# Patient Record
Sex: Male | Born: 1960 | Race: White | Hispanic: No | Smoking: Former smoker
Health system: Southern US, Community
[De-identification: ages and names within clinical notes are randomized; demographics above are authoritative.]

## PROBLEM LIST (undated history)

## (undated) DIAGNOSIS — I10 Essential (primary) hypertension: Secondary | ICD-10-CM

## (undated) DIAGNOSIS — E119 Type 2 diabetes mellitus without complications: Secondary | ICD-10-CM

## (undated) DIAGNOSIS — F329 Major depressive disorder, single episode, unspecified: Secondary | ICD-10-CM

## (undated) DIAGNOSIS — F32A Depression, unspecified: Secondary | ICD-10-CM

## (undated) DIAGNOSIS — G8929 Other chronic pain: Secondary | ICD-10-CM

## (undated) DIAGNOSIS — F419 Anxiety disorder, unspecified: Secondary | ICD-10-CM

## (undated) HISTORY — PX: WRIST SURGERY: SHX841

---

## 2011-07-03 ENCOUNTER — Emergency Department (HOSPITAL_BASED_OUTPATIENT_CLINIC_OR_DEPARTMENT_OTHER)
Admission: EM | Admit: 2011-07-03 | Discharge: 2011-07-03 | Disposition: A | Discharge status: Home / Self Care | Payer: 59 | Attending: Emergency Medicine | Admitting: Emergency Medicine

## 2011-07-03 ENCOUNTER — Encounter (HOSPITAL_BASED_OUTPATIENT_CLINIC_OR_DEPARTMENT_OTHER): Payer: Self-pay | Admitting: *Deleted

## 2011-07-03 DIAGNOSIS — Z87891 Personal history of nicotine dependence: Secondary | ICD-10-CM | POA: Insufficient documentation

## 2011-07-03 DIAGNOSIS — M5430 Sciatica, unspecified side: Secondary | ICD-10-CM

## 2011-07-03 DIAGNOSIS — M549 Dorsalgia, unspecified: Secondary | ICD-10-CM | POA: Insufficient documentation

## 2011-07-03 MED ORDER — CYCLOBENZAPRINE HCL 10 MG PO TABS: 10.0000 mg | ORAL_TABLET | Freq: Three times a day (TID) | ORAL | Status: AC | PRN

## 2011-07-03 MED ORDER — CEPHALEXIN 500 MG PO CAPS: 500.0000 mg | ORAL_CAPSULE | Freq: Four times a day (QID) | ORAL | Status: AC

## 2011-07-03 MED ORDER — HYDROCODONE-ACETAMINOPHEN 7.5-325 MG PO TABS: 1.0000 | ORAL_TABLET | Freq: Four times a day (QID) | ORAL | Status: AC | PRN

## 2011-07-03 NOTE — Discharge Instructions (Signed)

## 2011-07-03 NOTE — ED Provider Notes (Signed)
Medical screening examination/treatment/procedure(s) were performed by non-physician practitioner and as supervising physician I was immediately available for consultation/collaboration.    Temesha Queener R Naylah Cork, MD 07/03/11 2333 

## 2011-07-03 NOTE — ED Provider Notes (Signed)
History     CSN: [id number]  Arrival date & time 07/03/11  1445   First MD Initiated Contact with Patient 07/03/11 1617      Chief Complaint  Patient presents with  . Back Pain    (Consider location/radiation/quality/duration/timing/severity/associated sxs/prior treatment) Patient is a 51 y.o. male presenting with leg pain. The history is provided by the patient. No language interpreter was used.  Leg Pain  The incident occurred more than 1 week ago. The incident occurred at work. There was no injury mechanism. The pain is present in the left hip and left thigh. The quality of the pain is described as aching. The pain is at a severity of 6/10. The pain is moderate. The pain has been constant since onset. Pertinent negatives include no numbness. He reports no foreign bodies present. The symptoms are aggravated by nothing. He has tried NSAIDs and acetaminophen for the symptoms. The treatment provided no relief.  Pt has been treated with prednisone, meloxicam, hydrocodone and flexeril,    History reviewed. No pertinent past medical history.  History reviewed. No pertinent past surgical history.  History reviewed. No pertinent family history.  History  Substance Use Topics  . Smoking status: Former Games developer  . Smokeless tobacco: Not on file  . Alcohol Use: Yes      Review of Systems  Musculoskeletal: Positive for myalgias and joint swelling.  Neurological: Negative for numbness.  All other systems reviewed and are negative.    Allergies  Review of patient's allergies indicates no known allergies.  Home Medications   Current Outpatient Rx  Name Route Sig Dispense Refill  . ACETAMINOPHEN 500 MG PO TABS Oral Take 1,000 mg by mouth every 4 (four) hours as needed. For pain    . GREEN COFFEE BEAN PO Oral Take 1 capsule by mouth 2 (two) times daily.    . ADULT MULTIVITAMIN W/MINERALS CH Oral Take 1 tablet by mouth daily.    Marland Kitchen OVER THE COUNTER MEDICATION Oral Take 1 capsule by  mouth daily.    . CYCLOBENZAPRINE HCL 10 MG PO TABS Oral Take 10 mg by mouth 3 (three) times daily as needed. For pain    . HYDROCODONE-ACETAMINOPHEN 7.5-500 MG PO TABS Oral Take 1.5 tablets by mouth 3 (three) times daily as needed. For pain      BP 166/88  Pulse 82  Temp(Src) 98.2 F (36.8 C) (Oral)  Resp 20  Ht 5\' 9"  (1.753 m)  Wt 210 lb (95.255 kg)  BMI 31.01 kg/m2  SpO2 100%  Physical Exam  Nursing note and vitals reviewed. Constitutional: He is oriented to person, place, and time. He appears well-developed and well-nourished.  HENT:  Head: Normocephalic and atraumatic.  Musculoskeletal: He exhibits tenderness.       nontender ls spine,  Decreased range of motion,  Ns and nv intact  Neurological: He is alert and oriented to person, place, and time. He has normal reflexes.  Skin: Skin is warm and dry.  Psychiatric: He has a normal mood and affect.    ED Course  Procedures (including critical care time)  Labs Reviewed - No data to display No results found.   No diagnosis found.    MDM  Pt advised to follow up with Dr. Lajoyce Corners orthopaedist for complete evaluation.  I advised ice.  I will treat with hydrocone and flexerail as pt has some tempary relief with these        Elson Areas, [place] 07/03/11 1708

## 2011-07-03 NOTE — ED Notes (Addendum)
Pt states he missed a step on a ladder 4 weeks ago and caught himself. Since then, has had low back and buttock pain. Seen at [place]. Dx'd with sciatica. Given meds without relief. Vicodin and Flexeril did help.

## 2011-07-12 ENCOUNTER — Encounter (HOSPITAL_BASED_OUTPATIENT_CLINIC_OR_DEPARTMENT_OTHER): Payer: Self-pay | Admitting: *Deleted

## 2011-07-12 ENCOUNTER — Emergency Department (HOSPITAL_BASED_OUTPATIENT_CLINIC_OR_DEPARTMENT_OTHER)
Admission: EM | Admit: 2011-07-12 | Discharge: 2011-07-12 | Disposition: A | Discharge status: Home / Self Care | Payer: PRIVATE HEALTH INSURANCE | Attending: Emergency Medicine | Admitting: Emergency Medicine

## 2011-07-12 ENCOUNTER — Emergency Department (INDEPENDENT_AMBULATORY_CARE_PROVIDER_SITE_OTHER): Payer: PRIVATE HEALTH INSURANCE

## 2011-07-12 DIAGNOSIS — M79609 Pain in unspecified limb: Secondary | ICD-10-CM

## 2011-07-12 DIAGNOSIS — M549 Dorsalgia, unspecified: Secondary | ICD-10-CM | POA: Insufficient documentation

## 2011-07-12 DIAGNOSIS — M5410 Radiculopathy, site unspecified: Secondary | ICD-10-CM

## 2011-07-12 DIAGNOSIS — Z760 Encounter for issue of repeat prescription: Secondary | ICD-10-CM | POA: Insufficient documentation

## 2011-07-12 DIAGNOSIS — M533 Sacrococcygeal disorders, not elsewhere classified: Secondary | ICD-10-CM | POA: Insufficient documentation

## 2011-07-12 DIAGNOSIS — IMO0002 Reserved for concepts with insufficient information to code with codable children: Secondary | ICD-10-CM | POA: Insufficient documentation

## 2011-07-12 DIAGNOSIS — IMO0001 Reserved for inherently not codable concepts without codable children: Secondary | ICD-10-CM

## 2011-07-12 MED ORDER — CYCLOBENZAPRINE HCL 10 MG PO TABS: 10.0000 mg | ORAL_TABLET | Freq: Two times a day (BID) | ORAL | Status: AC | PRN

## 2011-07-12 MED ORDER — HYDROCODONE-ACETAMINOPHEN 7.5-500 MG PO TABS: 1.0000 | ORAL_TABLET | Freq: Four times a day (QID) | ORAL | Status: AC | PRN

## 2011-07-12 MED ORDER — KETOROLAC TROMETHAMINE 30 MG/ML IJ SOLN
30.0000 mg | Freq: Once | INTRAMUSCULAR | Status: AC
  Administered 2011-07-12: 30 mg via INTRAMUSCULAR
  Filled 2011-07-12: qty 1

## 2011-07-12 MED ORDER — OXYCODONE-ACETAMINOPHEN 5-325 MG PO TABS
1.0000 | ORAL_TABLET | Freq: Once | ORAL | Status: AC
  Administered 2011-07-12: 1 via ORAL
  Filled 2011-07-12: qty 1

## 2011-07-12 NOTE — ED Notes (Signed)
Patient states that his pain is down to a 4 at this time. Patient states that it feels like burning pain like "a red hot poker is being put in his back

## 2011-07-12 NOTE — ED Notes (Signed)
Missed follow up appt needs refill on meds

## 2011-07-12 NOTE — ED Provider Notes (Signed)
History     CSN: [id number]  Arrival date & time 07/12/11  1330   First MD Initiated Contact with Patient 07/12/11 1350      Chief Complaint  Patient presents with  . Back Pain  . Medication Refill    (Consider location/radiation/quality/duration/timing/severity/associated sxs/prior treatment) Patient is a 51 y.o. male presenting with back pain.  Back Pain     C/o back pain, LLE pain x 3 weeks. States that he stumbled backwards 3 weeks ago off ladder. Felt pull at that time. Was seen at [facility] and prescribed gabapentin and prednisone then seen here 3/31 and diagnosed with sciatica. Some relief with flexeril and norco. Denies hematuria/dysuria/freq/urgency C/O difficulty urinating when pain at maximum. Pain currently 5/10. States pain was worse yesterday. Denies history of recent trauma/falls. Denies h/o malignancy, DM, immunocompromise  injection drug use, immunosuppression, indwelling urinary catheter, prolonged steroid use, . No numbness/tingling/weakness of extremities. Occ numbness left lateral calf. Denies fever/chills. Denies saddle anesthesia, no urinary incontinence or retention. Has lost 100lbs in last one year with exercise/walking the dog    ED Notes, ED Provider Notes from 07/12/11 0000 to 07/12/11 13:41:09       Melissa Ramer Lottie Rater, RN 07/12/2011 13:39      Missed follow up appt needs refill on meds    History reviewed. No pertinent past medical history.  History reviewed. No pertinent past surgical history.  History reviewed. No pertinent family history.  History  Substance Use Topics  . Smoking status: Former Games developer  . Smokeless tobacco: Not on file  . Alcohol Use: Yes      Review of Systems  Musculoskeletal: Positive for back pain.  All other systems reviewed and are negative.   except as noted HPI   Allergies  Review of patient's allergies indicates no known allergies.  Home Medications   Current Outpatient Rx  Name Route Sig Dispense Refill  .  ACETAMINOPHEN 500 MG PO TABS Oral Take 1,000 mg by mouth every 4 (four) hours as needed. For pain    . CEPHALEXIN 500 MG PO CAPS Oral Take 1 capsule (500 mg total) by mouth 4 (four) times daily. 20 capsule 0  . CYCLOBENZAPRINE HCL 10 MG PO TABS Oral Take 1 tablet (10 mg total) by mouth 3 (three) times daily as needed. For pain 30 tablet 0  . CYCLOBENZAPRINE HCL 10 MG PO TABS Oral Take 1 tablet (10 mg total) by mouth 2 (two) times daily as needed for muscle spasms. 20 tablet 0  . GREEN COFFEE BEAN PO Oral Take 1 capsule by mouth 2 (two) times daily.    Marland Kitchen HYDROCODONE-ACETAMINOPHEN 7.5-500 MG PO TABS Oral Take 1 tablet by mouth every 6 (six) hours as needed for pain. 20 tablet 0  . HYDROCODONE-ACETAMINOPHEN 7.5-500 MG PO TABS Oral Take 1.5 tablets by mouth 3 (three) times daily as needed. For pain    . HYDROCODONE-ACETAMINOPHEN 7.5-325 MG PO TABS Oral Take 1 tablet by mouth every 6 (six) hours as needed for pain. 16 tablet 0  . ADULT MULTIVITAMIN W/MINERALS CH Oral Take 1 tablet by mouth daily.    Marland Kitchen OVER THE COUNTER MEDICATION Oral Take 1 capsule by mouth daily.      BP 179/79  Pulse 75  Temp(Src) 98.3 F (36.8 C) (Oral)  Resp 18  SpO2 99%  Physical Exam  Nursing note and vitals reviewed. Constitutional: He is oriented to person, place, and time. He appears well-developed and well-nourished. No distress.  HENT:  Head: Atraumatic.  Mouth/Throat: Oropharynx is clear and moist.  Eyes: Conjunctivae are normal. Pupils are equal, round, and reactive to light.  Neck: Neck supple.  Cardiovascular: Normal rate, regular rhythm, normal heart sounds and intact distal pulses.  Exam reveals no gallop and no friction rub.   No murmur heard. Pulmonary/Chest: Effort normal. No respiratory distress. He has no wheezes. He has no rales.  Abdominal: Soft. Bowel sounds are normal. There is no tenderness. There is no rebound and no guarding.  Musculoskeletal: Normal range of motion. He exhibits tenderness. He  exhibits no edema.       Straight leg raise neg b/l Gross sensation intact throughout Dec gross sensation lateral lower leg Cap refill < 3 sec DP/PT intact  +Lt SI ttp  Neurological: He is alert and oriented to person, place, and time.       Strength 5/5 bl LE  Skin: Skin is warm and dry.  Psychiatric: He has a normal mood and affect.    ED Course  Procedures (including critical care time)  Labs Reviewed - No data to display Dg Sacrum/coccyx  07/12/2011  *RADIOLOGY REPORT*  Clinical Data: Gluteal pain.  Left leg and femur pain.  SACRUM AND COCCYX - 2+ VIEW  Comparison: None.  Findings: Sacrum and coccyx appear within normal limits. Visualized pelvic bones are normal.  No displaced fracture is identified.  IMPRESSION: Negative.  Original Report Authenticated By: Andreas Newport, M.D.     1. Disorder of SI (sacroiliac) joint   2. Radiculopathy       MDM  Lt back/buttocks pain with radiation to leg and intermittent numbness/paresthesias. XR ordered 2/2 recent significant weight loss. Negative for tumor. Neurovascularly intact at this time. Feeling better in ED, will discharge home with pain medication, f/u as needed. Precautions for return.        Forbes Cellar, MD 07/13/11 [phone]

## 2011-07-12 NOTE — Discharge Instructions (Signed)
Sacroiliac Joint Dysfunction The sacroiliac joint connects the lower part of the spine (the sacrum) with the bones of the pelvis. CAUSES  Sometimes, there is no obvious reason for sacroiliac joint dysfunction. Other times, it may occur   During pregnancy.   After injury, such as:   Car accidents.   Sport-related injuries.   Work-related injuries.   Due to one leg being shorter than the other.   Due to other conditions that affect the joints, such as:   Rheumatoid arthritis.   Gout.   Psoriasis.   Joint infection (septic arthritis).  SYMPTOMS  Symptoms may include:  Pain in the:   Lower back.   Buttocks.   Groin.   Thighs and legs.   Difficult sitting, standing, walking, lying, bending or lifting.  DIAGNOSIS  A number of tests may be used to help diagnose the cause of sacroiliac joint dysfunction, including:  Imaging tests to look for other causes of pain, including:   MRI.   CT scan.   Bone scan.   Diagnostic injection: During a special x-ray (called fluoroscopy), a needle is put into the sacroiliac joint. A numbing medicine is injected into the joint. If the pain is improved or stopped, the diagnosis of sacroiliac joint dysfunction is more likely.  TREATMENT  There are a number of types of treatment used for sacroiliac joint dysfunction, including:  Only take over-the-counter or prescription medicines for pain, discomfort, or fever as directed by your caregiver.   Medications to relax muscles.   Rest. Decreasing activity can help cut down on painful muscle spasms and allow the back to heal.   Application of heat or ice to the lower back may improve muscle spasms and soothe pain.   Brace. A special back brace, called a sacroiliac belt, can help support the joint while your back is healing.   Physical therapy can help teach comfortable positions and exercises to strengthen muscles that support the sacroiliac joint.   Cortisone injections. Injections  of steroid medicine into the joint can help decrease swelling and improve pain.   Hyaluronic acid injections. This chemical improves lubrication within the sacroiliac joint, thereby decreasing pain.   Radiofrequency ablation. A special needle is placed into the joint, where it burns away nerves that are carrying pain messages from the joint.   Surgery. Because pain occurs during movement of the joint, screws and plates may be installed in order to limit or prevent joint motion.  HOME CARE INSTRUCTIONS   Take all medications exactly as directed.   Follow instructions regarding both rest and physical activity, to avoid worsening the pain.   Do physical therapy exercises exactly as prescribed.  SEEK IMMEDIATE MEDICAL CARE IF:  You experience increasingly severe pain.   You develop new symptoms, such as numbness or tingling in your legs or feet.   You lose bladder or bowel control.  Document Released: 06/17/2008 Document Revised: 03/10/2011 Document Reviewed: 06/17/2008 [facility] Patient Information 2012 [place], [place].  RESOURCE GUIDE  Dental Problems  Patients with Medicaid: [facility] [phone] [address]                                           [phone] [address] Phone:  [phone]  Phone:  [phone]  If unable to pay or uninsured, contact:  [facility] or [facility]. to become qualified for the adult dental clinic.  Chronic Pain Problems Contact [facility]  [phone] Patients need to be referred by their primary care doctor.  Insufficient Money for Medicine Contact [employer]:  call "211" or [facility] [phone].  No Primary Care Doctor Call [facility]  [phone] Other agencies that provide inexpensive medical care    [facility]  [phone]    [facility]  [phone]    [facility]   [phone]    [facility]  [phone]    [facility]  [phone]    [facility]  [phone]  [facility]  [phone] [facility]  [phone] [facility]   [phone] (emergency services [phone])  Abuse/Neglect [facility] Child Abuse Hotline [phone] [facility] Child Abuse Hotline [phone] (After Hours)  Emergency Shelter [facility] [phone]  [facility] [place] [facility] [phone] [facility] [phone]  MRSA Hotline #:   (564)497-6685    [facility] Resources  [facility]  [employer]                           [facility] Dept. [address] [place]                     [address]         [address]  Blondell Reveal Phone:  [phone]                                  Phone:  [phone]                   Phone:  [phone]  [facility] Phone:  [phone]  [facility] Child Abuse Hotline [phone] (After Hours)

## 2011-08-11 ENCOUNTER — Encounter (HOSPITAL_BASED_OUTPATIENT_CLINIC_OR_DEPARTMENT_OTHER): Payer: Self-pay | Admitting: *Deleted

## 2011-08-11 ENCOUNTER — Emergency Department (HOSPITAL_BASED_OUTPATIENT_CLINIC_OR_DEPARTMENT_OTHER)
Admission: EM | Admit: 2011-08-11 | Discharge: 2011-08-12 | Disposition: A | Discharge status: Home / Self Care | Payer: Worker's Compensation | Attending: Emergency Medicine | Admitting: Emergency Medicine

## 2011-08-11 ENCOUNTER — Emergency Department (INDEPENDENT_AMBULATORY_CARE_PROVIDER_SITE_OTHER): Payer: Worker's Compensation

## 2011-08-11 DIAGNOSIS — S8780XA Crushing injury of unspecified lower leg, initial encounter: Secondary | ICD-10-CM | POA: Insufficient documentation

## 2011-08-11 DIAGNOSIS — Z23 Encounter for immunization: Secondary | ICD-10-CM | POA: Insufficient documentation

## 2011-08-11 DIAGNOSIS — M79609 Pain in unspecified limb: Secondary | ICD-10-CM

## 2011-08-11 DIAGNOSIS — X58XXXA Exposure to other specified factors, initial encounter: Secondary | ICD-10-CM

## 2011-08-11 HISTORY — DX: Other chronic pain: G89.29

## 2011-08-11 MED ORDER — OXYCODONE-ACETAMINOPHEN 5-325 MG PO TABS
2.0000 | ORAL_TABLET | Freq: Once | ORAL | Status: AC
  Administered 2011-08-11: 2 via ORAL
  Filled 2011-08-11: qty 1

## 2011-08-11 MED ORDER — TETANUS-DIPHTH-ACELL PERTUSSIS 5-2.5-18.5 LF-MCG/0.5 IM SUSP
0.5000 mL | Freq: Once | INTRAMUSCULAR | Status: AC
  Administered 2011-08-11: 0.5 mL via INTRAMUSCULAR
  Filled 2011-08-11: qty 0.5

## 2011-08-11 MED ORDER — OXYCODONE-ACETAMINOPHEN 5-325 MG PO TABS
ORAL_TABLET | ORAL | Status: AC
  Filled 2011-08-11: qty 1
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
## 2011-08-11 NOTE — ED Notes (Signed)
Called [facility] for orthopedic consult

## 2011-08-11 NOTE — ED Notes (Signed)
Patient transported to X-ray 
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
## 2011-08-11 NOTE — ED Notes (Signed)
Family at bedside. 

## 2011-08-12 ENCOUNTER — Emergency Department (INDEPENDENT_AMBULATORY_CARE_PROVIDER_SITE_OTHER): Payer: Worker's Compensation

## 2011-08-12 DIAGNOSIS — M25569 Pain in unspecified knee: Secondary | ICD-10-CM

## 2011-08-12 MED ORDER — OXYCODONE-ACETAMINOPHEN 5-325 MG PO TABS: 1.0000 | ORAL_TABLET | Freq: Four times a day (QID) | ORAL | Status: AC | PRN
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
## 2011-08-12 NOTE — Discharge Instructions (Signed)
Crush Injury, Fingers or Toes  A crush injury to the fingers or toes means the tissues have been damaged by being squeezed (compressed). There will be bleeding into the tissues and swelling. Often, blood will collect under the skin. When this happens, the skin on the finger often dies and may slough off (shed) 1 week to 10 days later. Usually, new skin is growing underneath. If the injury has been too severe and the tissue does not survive, the damaged tissue may begin to turn black over several days.   Wounds which occur because of the crushing may be stitched (sutured) shut. However, crush injuries are more likely to become infected than other injuries.These wounds may not be closed as tightly as other types of cuts to prevent infection. Nails involved are often lost. These usually grow back over several weeks.   DIAGNOSIS  X-rays may be taken to see if there is any injury to the bones.  TREATMENT  Broken bones (fractures) may be treated with splinting, depending on the fracture. Often, no treatment is required for fractures of the last bone in the fingers or toes.  HOME CARE INSTRUCTIONS    The crushed part should be raised (elevated) above the heart or center of the chest as much as possible for the first several days or as directed. This helps with pain and lessens swelling. Less swelling increases the chances that the crushed part will survive.   Put ice on the injured area.   Put ice in a plastic bag.   Place a towel between your skin and the bag.   Leave the ice on for 15 to 20 minutes, 3 to 4 times a day for the first 2 days.   Only take over-the-counter or prescription medicines for pain, discomfort, or fever as directed by your caregiver.   Use your injured part only as directed.   Change your bandages (dressings) as directed.   Keep all follow-up appointments as directed by your caregiver. Not keeping your appointment could result in a chronic or permanent injury, pain, and disability. If there  is any problem keeping the appointment, you must call to reschedule.  SEEK IMMEDIATE MEDICAL CARE IF:    There is redness, swelling, or increasing pain in the wound area.   Pus is coming from the wound.   You have a fever.   You notice a bad smell coming from the wound or dressing.   The edges of the wound do not stay together after the sutures have been removed.   You are unable to move the injured finger or toe.  MAKE SURE YOU:    Understand these instructions.   Will watch your condition.   Will get help right away if you are not doing well or get worse.  Document Released: 03/21/2005 Document Revised: 03/10/2011 Document Reviewed: 08/06/2010  [employer] Patient Information 2012 [employer].

## 2011-08-23 ENCOUNTER — Emergency Department (HOSPITAL_BASED_OUTPATIENT_CLINIC_OR_DEPARTMENT_OTHER)
Admission: EM | Admit: 2011-08-23 | Discharge: 2011-08-23 | Disposition: A | Discharge status: Home / Self Care | Payer: Worker's Compensation | Attending: Emergency Medicine | Admitting: Emergency Medicine

## 2011-08-23 ENCOUNTER — Encounter (HOSPITAL_BASED_OUTPATIENT_CLINIC_OR_DEPARTMENT_OTHER): Payer: Self-pay | Admitting: *Deleted

## 2011-08-23 ENCOUNTER — Emergency Department (HOSPITAL_BASED_OUTPATIENT_CLINIC_OR_DEPARTMENT_OTHER): Payer: Worker's Compensation

## 2011-08-23 DIAGNOSIS — S61519A Laceration without foreign body of unspecified wrist, initial encounter: Secondary | ICD-10-CM

## 2011-08-23 DIAGNOSIS — Y9289 Other specified places as the place of occurrence of the external cause: Secondary | ICD-10-CM | POA: Insufficient documentation

## 2011-08-23 DIAGNOSIS — W208XXA Other cause of strike by thrown, projected or falling object, initial encounter: Secondary | ICD-10-CM | POA: Insufficient documentation

## 2011-08-23 DIAGNOSIS — Y990 Civilian activity done for income or pay: Secondary | ICD-10-CM | POA: Insufficient documentation

## 2011-08-23 DIAGNOSIS — S61509A Unspecified open wound of unspecified wrist, initial encounter: Secondary | ICD-10-CM | POA: Insufficient documentation

## 2011-08-23 HISTORY — DX: Anxiety disorder, unspecified: F41.9

## 2011-08-23 HISTORY — DX: Major depressive disorder, single episode, unspecified: F32.9

## 2011-08-23 HISTORY — DX: Depression, unspecified: F32.A

## 2011-08-23 MED ORDER — LIDOCAINE HCL 2 % IJ SOLN
20.0000 mL | Freq: Once | INTRAMUSCULAR | Status: AC
  Administered 2011-08-23: 400 mg via INTRADERMAL
  Filled 2011-08-23: qty 1

## 2011-08-23 NOTE — ED Provider Notes (Signed)
History     CSN: [id number]  Arrival date & time 08/23/11  1141   First MD Initiated Contact with Patient 08/23/11 1204      Chief Complaint  Patient presents with  . Extremity Laceration    left wrist    (Consider location/radiation/quality/duration/timing/severity/associated sxs/prior treatment) HPI Comments: Pt states that he was closing the hood to a car at work and the lady pulled away and the hood came down on the screen  Patient is a 51 y.o. male presenting with wrist pain. The history is provided by the patient. No language interpreter was used.  Wrist Pain This is a new problem. The current episode started today. The problem occurs constantly. The problem has been unchanged. The symptoms are aggravated by twisting. He has tried nothing for the symptoms.    Past Medical History  Diagnosis Date  . Chronic pain   . Anxiety and depression     Past Surgical History  Procedure Date  . Wrist surgery     No family history on file.  History  Substance Use Topics  . Smoking status: Former Games developer  . Smokeless tobacco: Not on file  . Alcohol Use: No      Review of Systems  Constitutional: Negative.   Respiratory: Negative.   Cardiovascular: Negative.   Musculoskeletal:       C/o pain up the wrist    Allergies  Review of patient's allergies indicates no known allergies.  Home Medications   Current Outpatient Rx  Name Route Sig Dispense Refill  . CLONAZEPAM 0.5 MG PO TABS Oral Take 0.5 mg by mouth 2 (two) times daily as needed.    . SERTRALINE HCL 100 MG PO TABS Oral Take 100 mg by mouth daily.    . ACETAMINOPHEN 500 MG PO TABS Oral Take 1,000 mg by mouth every 4 (four) hours as needed. For pain    . CYCLOBENZAPRINE HCL 10 MG PO TABS Oral Take 1 tablet (10 mg total) by mouth 3 (three) times daily as needed. For pain 30 tablet 0  . GREEN COFFEE BEAN PO Oral Take 1 capsule by mouth 2 (two) times daily.    Marland Kitchen HYDROCODONE-ACETAMINOPHEN 7.5-500 MG PO TABS Oral  Take 1.5 tablets by mouth 3 (three) times daily as needed. For pain    . ADULT MULTIVITAMIN W/MINERALS CH Oral Take 1 tablet by mouth daily.    Marland Kitchen OVER THE COUNTER MEDICATION Oral Take 1 capsule by mouth daily.    . OXYCODONE-ACETAMINOPHEN 5-325 MG PO TABS Oral Take 1 tablet by mouth every 6 (six) hours as needed for pain. 13 tablet 0    BP 175/63  Pulse 66  Temp 97.6 F (36.4 C)  Resp 20  SpO2 100%  Physical Exam  Nursing note and vitals reviewed. Constitutional: He is oriented to person, place, and time. He appears well-developed and well-nourished.  HENT:  Head: Normocephalic and atraumatic.  Eyes: Conjunctivae and EOM are normal.  Neck: Neck supple.  Cardiovascular: Normal rate and regular rhythm.   Pulmonary/Chest: Effort normal and breath sounds normal.  Musculoskeletal:       Pulses intact:pt has full rom:pt has generalized tenderness to the left wrist  Neurological: He is alert and oriented to person, place, and time. Coordination normal.  Skin:       Pt has a laceration to the left medial wrist    ED Course  LACERATION REPAIR Performed by: Teressa Lower Authorized by: Teressa Lower Consent: Verbal consent obtained. Written consent not  obtained. Risks and benefits: risks, benefits and alternatives were discussed Consent given by: patient Patient understanding: patient states understanding of the procedure being performed Patient identity confirmed: verbally with patient Time out: Immediately prior to procedure a "time out" was called to verify the correct patient, procedure, equipment, support staff and site/side marked as required. Body area: upper extremity Location details: left wrist Laceration length: 3 cm Foreign bodies: no foreign bodies Anesthesia: local infiltration Local anesthetic: lidocaine 2% without epinephrine Irrigation solution: saline Irrigation method: syringe Amount of cleaning: standard Skin closure: 4-0 Prolene Number of sutures:  6 Technique: simple Approximation: close Approximation difficulty: simple Dressing: 4x4 sterile gauze Patient tolerance: Patient tolerated the procedure well with no immediate complications.   (including critical care time)  Labs Reviewed - No data to display Dg Wrist Complete Left  08/23/2011  *RADIOLOGY REPORT*  Clinical Data: Wrist injury with laceration.  LEFT WRIST - COMPLETE 3+ VIEW  Comparison: None.  Findings: The mineralization and alignment are normal.  There is no evidence of acute fracture or dislocation.  There are mild degenerative changes throughout the wrist.  No focal soft tissue swelling or foreign body is identified.  IMPRESSION: No acute osseous findings.  Original Report Authenticated By: Gerrianne Scale, M.D.     1. Wrist laceration       MDM  No fracture noted on x-ray:pt wound closed no fb noted:pt is neurologically intact:pt had urine drug screen done for work        Teressa Lower, NP 08/23/11 1337

## 2011-08-23 NOTE — Discharge Instructions (Signed)
Laceration Care, Adult A laceration is a cut that goes through all layers of the skin. The cut goes into the tissue beneath the skin. HOME CARE For stitches (sutures) or staples:  Keep the cut clean and dry.   If you have a bandage (dressing), change it at least once a day. Change the bandage if it gets wet or dirty, or as told by your doctor.   Wash the cut with soap and water 2 times a day. Rinse the cut with water. Pat it dry with a clean towel.   Put a thin layer of medicated cream on the cut as told by your doctor.   You may shower after the first 24 hours. Do not soak the cut in water until the stitches are removed.   Only take medicines as told by your doctor.   Have your stitches or staples removed as told by your doctor.  For skin adhesive strips:  Keep the cut clean and dry.   Do not get the strips wet. You may take a bath, but be careful to keep the cut dry.   If the cut gets wet, pat it dry with a clean towel.   The strips will fall off on their own. Do not remove the strips that are still stuck to the cut.  For wound glue:  You may shower or take baths. Do not soak or scrub the cut. Do not swim. Avoid heavy sweating until the glue falls off on its own. After a shower or bath, pat the cut dry with a clean towel.   Do not put medicine on your cut until the glue falls off.   If you have a bandage, do not put tape over the glue.   Avoid lots of sunlight or tanning lamps until the glue falls off. Put sunscreen on the cut for the first year to reduce your scar.   The glue will fall off on its own. Do not pick at the glue.  You may need a tetanus shot if:  You cannot remember when you had your last tetanus shot.   You have never had a tetanus shot.  If you need a tetanus shot and you choose not to have one, you may get tetanus. Sickness from tetanus can be serious. GET HELP RIGHT AWAY IF:   Your pain does not get better with medicine.   Your arm, hand, leg, or  foot loses feeling (numbness) or changes color.   Your cut is bleeding.   Your joint feels weak, or you cannot use your joint.   You have painful lumps on your body.   Your cut is red, puffy (swollen), or painful.   You have a red line on the skin near the cut.   You have yellowish-white fluid (pus) coming from the cut.   You have a fever.   You have a bad smell coming from the cut or bandage.   Your cut breaks open before or after stitches are removed.   You notice something coming out of the cut, such as wood or glass.   You cannot move a finger or toe.  MAKE SURE YOU:   Understand these instructions.   Will watch your condition.   Will get help right away if you are not doing well or get worse.  Document Released: 09/07/2007 Document Revised: 03/10/2011 Document Reviewed: 09/14/2010 Exit[employer] Patient Information 2012 [employer].Stitches, Staples, or Skin Adhesive Strips  Stitches (sutures), staples, and skin adhesive strips hold   the skin together as it heals. They will usually be in place for 7 days or less. HOME CARE  Wash your hands with soap and water before and after you touch your wound.   Only take medicine as told by your doctor.   Cover your wound only if your doctor told you to. Otherwise, leave it open to air.   Do not get your stitches wet or dirty. If they get dirty, dab them gently with a clean washcloth. Wet the washcloth with soapy water. Do not rub. Pat them dry gently.   Do not put medicine or medicated cream on your stitches unless your doctor told you to.   Do not take out your own stitches or staples. Skin adhesive strips will fall off by themselves.   Do not pick at the wound. Picking can cause an infection.   Do not miss your follow-up appointment.   If you have problems or questions, call your doctor.  GET HELP RIGHT AWAY IF:   You have a temperature by mouth above 102 F (38.9 C), not controlled by medicine.   You have chills.     You have redness or pain around your stitches.   There is puffiness (swelling) around your stitches.   You notice fluid (drainage) from your stitches.   There is a bad smell coming from your wound.  MAKE SURE YOU:  Understand these instructions.   Will watch your condition.   Will get help if you are not doing well or get worse.  Document Released: 01/16/2009 Document Revised: 03/10/2011 Document Reviewed: 01/16/2009 Exit[employer] Patient Information 2012 [employer]. 

## 2011-08-23 NOTE — ED Notes (Signed)
Patient states the hood of a car fell down on his left wrist.  Laceration with bleeding controlled.  Extremity warm to touch.

## 2011-08-29 NOTE — ED Provider Notes (Signed)
Medical screening examination/treatment/procedure(s) were performed by non-physician practitioner and as supervising physician I was immediately available for consultation/collaboration.   Loren Racer, MD 08/29/11 [phone]

## 2011-09-02 ENCOUNTER — Emergency Department (HOSPITAL_BASED_OUTPATIENT_CLINIC_OR_DEPARTMENT_OTHER)
Admission: EM | Admit: 2011-09-02 | Discharge: 2011-09-02 | Disposition: A | Discharge status: Home / Self Care | Payer: 59 | Attending: Emergency Medicine | Admitting: Emergency Medicine

## 2011-09-02 ENCOUNTER — Encounter (HOSPITAL_BASED_OUTPATIENT_CLINIC_OR_DEPARTMENT_OTHER): Payer: Self-pay

## 2011-09-02 DIAGNOSIS — G8929 Other chronic pain: Secondary | ICD-10-CM | POA: Insufficient documentation

## 2011-09-02 DIAGNOSIS — IMO0002 Reserved for concepts with insufficient information to code with codable children: Secondary | ICD-10-CM

## 2011-09-02 DIAGNOSIS — Z4802 Encounter for removal of sutures: Secondary | ICD-10-CM | POA: Insufficient documentation

## 2011-09-02 NOTE — Discharge Instructions (Signed)
Return to the ED with any concerns including fever, redness, pus draining from wound, or any other alarming symptoms

## 2011-09-02 NOTE — ED Notes (Signed)
Sutures removed by Dr. Karma Ganja and neosporin applied to wound.

## 2011-09-02 NOTE — ED Notes (Signed)
Here for suture removal in left wrist.

## 2011-09-02 NOTE — ED Provider Notes (Signed)
History     CSN: [id number]  Arrival date & time 09/02/11  0911   First MD Initiated Contact with Patient 09/02/11 [phone]      Chief Complaint  Patient presents with  . Suture / Staple Removal    (Consider location/radiation/quality/duration/timing/severity/associated sxs/prior treatment) HPI Pt presents for removal of sutures from his left wrist.  Sutures x 6 were placed on 08/23/11 for laceration.  Pt denies any drainage or redness around wound.  Still has some pain distal to the wound, but otherwise it has been healing well.  No fever/chills, no swelling.    Past Medical History  Diagnosis Date  . Chronic pain   . Anxiety and depression     Past Surgical History  Procedure Date  . Wrist surgery     No family history on file.  History  Substance Use Topics  . Smoking status: Former Games developer  . Smokeless tobacco: Not on file  . Alcohol Use: No      Review of Systems ROS reviewed and all otherwise negative except for mentioned in HPI  Allergies  Review of patient's allergies indicates no known allergies.  Home Medications   Current Outpatient Rx  Name Route Sig Dispense Refill  . ACETAMINOPHEN 500 MG PO TABS Oral Take 1,000 mg by mouth every 4 (four) hours as needed. For pain    . CLONAZEPAM 0.5 MG PO TABS Oral Take 0.5 mg by mouth 2 (two) times daily as needed.    . CYCLOBENZAPRINE HCL 10 MG PO TABS Oral Take 1 tablet (10 mg total) by mouth 3 (three) times daily as needed. For pain 30 tablet 0  . GREEN COFFEE BEAN PO Oral Take 1 capsule by mouth 2 (two) times daily.    Marland Kitchen HYDROCODONE-ACETAMINOPHEN 7.5-500 MG PO TABS Oral Take 1.5 tablets by mouth 3 (three) times daily as needed. For pain    . ADULT MULTIVITAMIN W/MINERALS CH Oral Take 1 tablet by mouth daily.    Marland Kitchen OVER THE COUNTER MEDICATION Oral Take 1 capsule by mouth daily.    . SERTRALINE HCL 100 MG PO TABS Oral Take 100 mg by mouth daily.      BP 158/69  Pulse 66  Temp(Src) 98.4 F (36.9 C) (Oral)  Resp  16  SpO2 98% Vitals reviewed Physical Exam Physical Examination: General appearance - alert, well appearing, and in no distress Mental status - alert, oriented to person, place, and time Extremities - peripheral pulses normal, no pedal edema, no clubbing or cyanosis Skin - left wrist with healing laceration with sutures intact over dorsal aspect- no surrounding erythema, normal coloration and turgor, no rashes  ED Course  Procedures (including critical care time)  Labs Reviewed - No data to display No results found.   1. Dressing change/suture removal       MDM  Pt presenting for suture removal of left wrist- sutures have been in place x 10 days.  6 sutures removed without difficulty.  No signs of infection, wound appears to be healing well.  Pt discharged with strict return precautions.  He is agreeable with this plan.         Ethelda Chick, MD 09/02/11 1201

## 2013-12-13 ENCOUNTER — Emergency Department (HOSPITAL_BASED_OUTPATIENT_CLINIC_OR_DEPARTMENT_OTHER)
Admission: EM | Admit: 2013-12-13 | Discharge: 2013-12-14 | Disposition: A | Discharge status: Home / Self Care | Payer: PRIVATE HEALTH INSURANCE | Attending: Emergency Medicine | Admitting: Emergency Medicine

## 2013-12-13 ENCOUNTER — Encounter (HOSPITAL_BASED_OUTPATIENT_CLINIC_OR_DEPARTMENT_OTHER): Payer: Self-pay | Admitting: Emergency Medicine

## 2013-12-13 ENCOUNTER — Emergency Department (HOSPITAL_BASED_OUTPATIENT_CLINIC_OR_DEPARTMENT_OTHER): Payer: PRIVATE HEALTH INSURANCE

## 2013-12-13 DIAGNOSIS — Z8719 Personal history of other diseases of the digestive system: Secondary | ICD-10-CM

## 2013-12-13 DIAGNOSIS — F411 Generalized anxiety disorder: Secondary | ICD-10-CM | POA: Insufficient documentation

## 2013-12-13 DIAGNOSIS — R109 Unspecified abdominal pain: Secondary | ICD-10-CM | POA: Insufficient documentation

## 2013-12-13 DIAGNOSIS — K7689 Other specified diseases of liver: Secondary | ICD-10-CM | POA: Insufficient documentation

## 2013-12-13 DIAGNOSIS — F3289 Other specified depressive episodes: Secondary | ICD-10-CM | POA: Insufficient documentation

## 2013-12-13 DIAGNOSIS — G8929 Other chronic pain: Secondary | ICD-10-CM | POA: Insufficient documentation

## 2013-12-13 DIAGNOSIS — F329 Major depressive disorder, single episode, unspecified: Secondary | ICD-10-CM | POA: Insufficient documentation

## 2013-12-13 DIAGNOSIS — E669 Obesity, unspecified: Secondary | ICD-10-CM | POA: Insufficient documentation

## 2013-12-13 DIAGNOSIS — Z87891 Personal history of nicotine dependence: Secondary | ICD-10-CM | POA: Insufficient documentation

## 2013-12-13 DIAGNOSIS — Z79899 Other long term (current) drug therapy: Secondary | ICD-10-CM | POA: Insufficient documentation

## 2013-12-13 DIAGNOSIS — K760 Fatty (change of) liver, not elsewhere classified: Secondary | ICD-10-CM

## 2013-12-13 LAB — CBC WITH DIFFERENTIAL/PLATELET
BASOS ABS: 0 10*3/uL (ref 0.0–0.1)
BASOS PCT: 0 % (ref 0–1)
Eosinophils Absolute: 0.1 10*3/uL (ref 0.0–0.7)
Eosinophils Relative: 1 % (ref 0–5)
HEMATOCRIT: 42.1 % (ref 39.0–52.0)
Hemoglobin: 14.8 g/dL (ref 13.0–17.0)
Lymphocytes Relative: 11 % — ABNORMAL LOW (ref 12–46)
Lymphs Abs: 1.2 10*3/uL (ref 0.7–4.0)
MCH: 32.4 pg (ref 26.0–34.0)
MCHC: 35.2 g/dL (ref 30.0–36.0)
MCV: 92.1 fL (ref 78.0–100.0)
MONO ABS: 0.7 10*3/uL (ref 0.1–1.0)
MONOS PCT: 6 % (ref 3–12)
Neutro Abs: 8.9 10*3/uL — ABNORMAL HIGH (ref 1.7–7.7)
Neutrophils Relative %: 82 % — ABNORMAL HIGH (ref 43–77)
Platelets: 184 10*3/uL (ref 150–400)
RBC: 4.57 MIL/uL (ref 4.22–5.81)
RDW: 12.3 % (ref 11.5–15.5)
WBC: 10.9 10*3/uL — ABNORMAL HIGH (ref 4.0–10.5)

## 2013-12-13 LAB — URINE MICROSCOPIC-ADD ON

## 2013-12-13 LAB — COMPREHENSIVE METABOLIC PANEL
ALT: 58 U/L — AB (ref 0–53)
AST: 39 U/L — ABNORMAL HIGH (ref 0–37)
Albumin: 4.4 g/dL (ref 3.5–5.2)
Alkaline Phosphatase: 68 U/L (ref 39–117)
Anion gap: 17 — ABNORMAL HIGH (ref 5–15)
BUN: 19 mg/dL (ref 6–23)
CO2: 23 mEq/L (ref 19–32)
Calcium: 10 mg/dL (ref 8.4–10.5)
Chloride: 100 mEq/L (ref 96–112)
Creatinine, Ser: 0.9 mg/dL (ref 0.50–1.35)
GFR calc non Af Amer: >90 mL/min (ref >90)
GLUCOSE: 182 mg/dL — AB (ref 70–99)
Potassium: 4.4 mEq/L (ref 3.7–5.3)
Sodium: 140 mEq/L (ref 137–147)
TOTAL PROTEIN: 8 g/dL (ref 6.0–8.3)
Total Bilirubin: 0.4 mg/dL (ref 0.3–1.2)

## 2013-12-13 LAB — URINALYSIS, ROUTINE W REFLEX MICROSCOPIC
Bilirubin Urine: NEGATIVE
GLUCOSE, UA: NEGATIVE mg/dL
HGB URINE DIPSTICK: NEGATIVE
Ketones, ur: 15 mg/dL — AB
Leukocytes, UA: NEGATIVE
Nitrite: NEGATIVE
Protein, ur: 30 mg/dL — AB
SPECIFIC GRAVITY, URINE: 1.025 (ref 1.005–1.030)
Urobilinogen, UA: 0.2 mg/dL (ref 0.0–1.0)
pH: 6 (ref 5.0–8.0)

## 2013-12-13 LAB — LIPASE, BLOOD: Lipase: 30 U/L (ref 11–59)

## 2013-12-13 MED ORDER — IOHEXOL 300 MG/ML  SOLN: 100.0000 mL | Freq: Once | INTRAMUSCULAR | Status: AC | PRN

## 2013-12-13 MED ORDER — ONDANSETRON HCL 4 MG/2ML IJ SOLN
4.0000 mg | Freq: Once | INTRAMUSCULAR | Status: AC
  Administered 2013-12-13: 4 mg via INTRAVENOUS
  Filled 2013-12-13: qty 2

## 2013-12-13 MED ORDER — SODIUM CHLORIDE 0.9 % IV SOLN
1000.0000 mL | Freq: Once | INTRAVENOUS | Status: AC
  Administered 2013-12-13: 1000 mL via INTRAVENOUS

## 2013-12-13 MED ORDER — GI COCKTAIL ~~LOC~~
ORAL | Status: AC
  Filled 2013-12-13: qty 30

## 2013-12-13 MED ORDER — IOHEXOL 300 MG/ML  SOLN
50.0000 mL | Freq: Once | INTRAMUSCULAR | Status: AC | PRN
  Administered 2013-12-13: 50 mL via ORAL

## 2013-12-13 MED ORDER — GI COCKTAIL ~~LOC~~
30.0000 mL | Freq: Once | ORAL | Status: AC
  Administered 2013-12-13: 30 mL via ORAL

## 2013-12-13 MED ORDER — SODIUM CHLORIDE 0.9 % IV SOLN
1000.0000 mL | INTRAVENOUS | Status: DC
  Administered 2013-12-13: 1000 mL via INTRAVENOUS

## 2013-12-13 MED ORDER — HYDROMORPHONE HCL PF 1 MG/ML IJ SOLN
1.0000 mg | INTRAMUSCULAR | Status: DC | PRN
  Administered 2013-12-13 (×2): 1 mg via INTRAVENOUS
  Filled 2013-12-13 (×2): qty 1

## 2013-12-13 NOTE — ED Notes (Signed)
Epigastric pan onset around 1600 after drinking beer,  States pain is getting worse

## 2013-12-13 NOTE — ED Notes (Signed)
Pt unable to void at this time. 

## 2013-12-13 NOTE — ED Provider Notes (Signed)
CSN: [id number]     Arrival date & time 12/13/13  2124 History  This chart was scribed for Linwood Dibbles, MD by Gwenyth Ober, ED Scribe. This patient was seen in room MH04/MH04 and the patient's care was started at 11:34 PM.      Chief Complaint  Patient presents with  . Abdominal Pain    The history is provided by the patient. No language interpreter was used.   HPI Comments: Brady Friedman is a 53 y.o. male who presents to the Emergency Department complaining of dull epigastric pain radiating to his back that started 6 hours ago. He states that the pain gradually worsened 2 hours after ate some white castle sliders and 3 beers. He denies nausea and vomitting, cough, SOB, difficulty urinating, chest pain, and diarrhea. He has been on weight loss supplement. He has no history of abdominal surgeries.   Past Medical History  Diagnosis Date  . Chronic pain   . Anxiety and depression    Past Surgical History  Procedure Laterality Date  . Wrist surgery     History reviewed. No pertinent family history. History  Substance Use Topics  . Smoking status: Former Games developer  . Smokeless tobacco: Not on file  . Alcohol Use: 1.8 oz/week    3 Cans of beer per week    Review of Systems  Respiratory: Negative for cough and shortness of breath.   Cardiovascular: Negative for chest pain.  Gastrointestinal: Positive for abdominal pain. Negative for nausea, vomiting and diarrhea.  All other systems reviewed and are negative.     Allergies  Hydrocodone  Home Medications   Prior to Admission medications   Medication Sig Start Date End Date Taking? Authorizing Provider  acetaminophen (TYLENOL) 500 MG tablet Take 1,000 mg by mouth every 4 (four) hours as needed. For pain    Historical Provider, MD  clonazePAM (KLONOPIN) 0.5 MG tablet Take 0.5 mg by mouth 2 (two) times daily as needed.    Historical Provider, MD  cyclobenzaprine (FLEXERIL) 10 MG tablet Take 1 tablet (10 mg total) by mouth 3 (three)  times daily as needed. For pain 07/03/11   Lonia Skinner Sofia, PA-C  GREEN COFFEE BEAN PO Take 1 capsule by mouth 2 (two) times daily.    Historical Provider, MD  HYDROcodone-acetaminophen (LORTAB) 7.5-500 MG per tablet Take 1.5 tablets by mouth 3 (three) times daily as needed. For pain    Historical Provider, MD  Multiple Vitamin (MULITIVITAMIN WITH MINERALS) TABS Take 1 tablet by mouth daily.    Historical Provider, MD  OVER THE COUNTER MEDICATION Take 1 capsule by mouth daily.    Historical Provider, MD  sertraline (ZOLOFT) 100 MG tablet Take 100 mg by mouth daily.    Historical Provider, MD   BP 192/88  Pulse 61  Temp(Src) 98.6 F (37 C) (Oral)  Resp 18  Ht  (1.753 m)  Wt 240 lb (108.863 kg)  BMI 35.43 kg/m2  SpO2 95% Physical Exam  Nursing note and vitals reviewed. Constitutional: He appears well-developed and well-nourished. No distress.  Obese   HENT:  Head: Normocephalic and atraumatic.  Right Ear: External ear normal.  Left Ear: External ear normal.  Eyes: Conjunctivae are normal. Right eye exhibits no discharge. Left eye exhibits no discharge. No scleral icterus.  Neck: Neck supple. No tracheal deviation present.  Cardiovascular: Normal rate, regular rhythm and intact distal pulses.   Pulmonary/Chest: Effort normal and breath sounds normal. No stridor. No respiratory distress. He has no  wheezes. He has no rales.  Abdominal: Bowel sounds are normal. He exhibits no distension, no ascites and no mass. There is tenderness in the right upper quadrant and epigastric area. There is guarding. There is no rigidity and no rebound. No hernia.  Musculoskeletal: He exhibits no edema and no tenderness.  Neurological: He is alert. He has normal strength. No cranial nerve deficit or sensory deficit. He exhibits normal muscle tone. He displays no seizure activity. Coordination normal.  Skin: Skin is warm and dry. No rash noted.  Psychiatric: He has a normal mood and affect.    ED Course   Procedures (including critical care time)  DIAGNOSTIC STUDIES: Oxygen Saturation is 100% on room air, normal by my interpretation.    COORDINATION OF CARE: 11:34 PM-Ordered x-rays and blood work. Pt agrees with treatment plan.    Labs Review Labs Reviewed  CBC WITH DIFFERENTIAL - Abnormal; Notable for the following:    WBC 10.9 (*)    Neutrophils Relative % 82 (*)    Neutro Abs 8.9 (*)    Lymphocytes Relative 11 (*)    All other components within normal limits  COMPREHENSIVE METABOLIC PANEL - Abnormal; Notable for the following:    Glucose, Bld 182 (*)    AST 39 (*)    ALT 58 (*)    Anion gap 17 (*)    All other components within normal limits  URINALYSIS, ROUTINE W REFLEX MICROSCOPIC - Abnormal; Notable for the following:    Ketones, ur 15 (*)    Protein, ur 30 (*)    All other components within normal limits  LIPASE, BLOOD  URINE MICROSCOPIC-ADD ON    Imaging Review No results found.   EKG Interpretation   Date/Time:  Friday December 13 2013 21:42:10 EDT Ventricular Rate:  64 PR Interval:  102 QRS Duration: 126 QT Interval:  412 QTC Calculation: 425 R Axis:   64 Text Interpretation:  Sinus rhythm with short PR Right bundle branch block  Abnormal ECG No previous tracing Confirmed by Zamariyah Furukawa  MD-J, Catheryn Slifer ([id number]) on  12/13/2013 10:00:52 PM      MDM  Pt with ttp in upper abdomen.  Pt is a regular alcohol drinker but lipase is normal.  Will continue with pain meds and get a CT scan of the abdomen to evaluate further.  I personally performed the services described in this documentation, which was scribed in my presence.  The recorded information has been reviewed and is accurate.     Linwood Dibbles, MD 12/13/13 [phone]

## 2013-12-13 NOTE — ED Notes (Signed)
Pt reports epigastric pain that started around 1600 today, pt reports he had 3 beers this afternoon, drinks daily and pain started after beers, denies any sob,

## 2013-12-13 NOTE — ED Notes (Signed)
Pt reports decreased pain with gi cocktail

## 2013-12-13 NOTE — ED Notes (Signed)
Pt reports that pain started immediately after drinking a beer this evening, denies sob, chest pain or back pain. States that if he could through up he would feel better, describes as a burning in his stomach

## 2013-12-14 MED ORDER — OXYCODONE-ACETAMINOPHEN 5-325 MG PO TABS
2.0000 | ORAL_TABLET | Freq: Once | ORAL | Status: AC
  Administered 2013-12-14: 2 via ORAL
  Filled 2013-12-14: qty 2

## 2013-12-14 MED ORDER — OMEPRAZOLE 20 MG PO CPDR: 20.0000 mg | DELAYED_RELEASE_CAPSULE | Freq: Every day | ORAL | Status: AC

## 2013-12-14 MED ORDER — IOHEXOL 300 MG/ML  SOLN
100.0000 mL | Freq: Once | INTRAMUSCULAR | Status: AC | PRN
  Administered 2013-12-14: 100 mL via INTRAVENOUS

## 2013-12-14 MED ORDER — OXYCODONE-ACETAMINOPHEN 5-325 MG PO TABS: 1.0000 | ORAL_TABLET | Freq: Four times a day (QID) | ORAL | Status: AC | PRN

## 2013-12-14 NOTE — Discharge Instructions (Signed)
Fatty Liver  Fatty liver is the accumulation of fat in liver cells. It is also called hepatosteatosis or steatohepatitis. It is normal for your liver to contain some fat. If fat is more than 5 to 10% of your liver's weight, you have fatty liver.   There are often no symptoms (problems) for years while damage is still occurring. People often learn about their fatty liver when they have medical tests for other reasons. Fat can damage your liver for years or even decades without causing problems. When it becomes severe, it can cause fatigue, weight loss, weakness, and confusion.  This makes you more likely to develop more serious liver problems. The liver is the largest organ in the body. It does a lot of work and often gives no warning signs when it is sick until late in a disease.  The liver has many important jobs including:  · Breaking down foods.  · Storing vitamins, iron, and other minerals.  · Making proteins.  · Making bile for food digestion.  · Breaking down many products including medications, alcohol and some poisons.  CAUSES   There are a number of different conditions, medications, and poisons that can cause a fatty liver. Eating too many calories causes fat to build up in the liver. Not processing and breaking fats down normally may also cause this. Certain conditions, such as obesity, diabetes, and high triglycerides also cause this. Most fatty liver patients tend to be middle-aged and over weight.   Some causes of fatty liver are:  · Alcohol over consumption.  · Malnutrition.  · Steroid use.  · Valproic acid toxicity.  · Obesity.  · Cushing's syndrome.  · Poisons.  · Tetracycline in high dosages.  · Pregnancy.  · Diabetes.  · Hyperlipidemia.  · Rapid weight loss.  Some people develop fatty liver even having none of these conditions.  SYMPTOMS   Fatty liver most often causes no problems. This is called asymptomatic.  · It can be diagnosed with blood tests and also by a liver biopsy.  · It is one of the  most common causes of minor elevations of liver enzymes on routine blood tests.  · Specialized Imaging of the liver using ultrasound, CT (computed tomography) scan, or MRI (magnetic resonance imaging) can suggest a fatty liver but a biopsy is needed to confirm it.  · A biopsy involves taking a small sample of liver tissue. This is done by using a needle. It is then looked at under a microscope by a specialist.  TREATMENT   It is important to treat the cause. Simple fatty liver without a medical reason may not need treatment.  · Weight loss, fat restriction, and exercise in overweight patients produces inconsistent results but is worth trying.  · Fatty liver due to alcohol toxicity may not improve even with stopping drinking.  · Good control of diabetes may reduce fatty liver.  · Lower your triglycerides through diet, medication or both.  · Eat a balanced, healthy diet.  · Increase your physical activity.  · Get regular checkups from a liver specialist.  · There are no medical or surgical treatments for a fatty liver or NASH, but improving your diet and increasing your exercise may help prevent or reverse some of the damage.  PROGNOSIS   Fatty liver may cause no damage or it can lead to an inflammation of the liver. This is, called steatohepatitis. When it is linked to alcohol abuse, it is called alcoholic steatohepatitis. It often   is not linked to alcohol. It is then called nonalcoholic steatohepatitis, or NASH. Over time the liver may become scarred and hardened. This condition is called cirrhosis. Cirrhosis is serious and may lead to liver failure or cancer. NASH is one of the leading causes of cirrhosis. About 10-20% of Americans have fatty liver and a smaller 2-5% has NASH.  Document Released: 05/06/2005 Document Revised: 06/13/2011 Document Reviewed: 07/31/2013  [employer]® Patient Information ©2015 [employer]. This information is not intended to replace advice given to you by your health care provider. Make  sure you discuss any questions you have with your health care provider.

## 2014-10-23 IMAGING — CT CT ABD-PELV W/ CM
2 of 5 series · 16 of 46 positions shown, 18 images · IV contrast (APPLIED)
Comparison: None.

CLINICAL DATA: Epigastric pain.

EXAM:
CT ABDOMEN AND PELVIS WITH CONTRAST
TECHNIQUE: Multidetector CT imaging of the abdomen and pelvis was performed
using the standard protocol following bolus administration of
intravenous contrast.
CONTRAST:  50mL OMNIPAQUE IOHEXOL 300 MG/ML SOLN, 100mL OMNIPAQUE
IOHEXOL 300 MG/ML SOLN

[Series 2: abd/pelvis 5.0 b31f · axial · 0.88mm/px · z∈[+566,+991]mm · 13 of 96 slices shown, 15 images]
[im 6/96  soft-tissue]
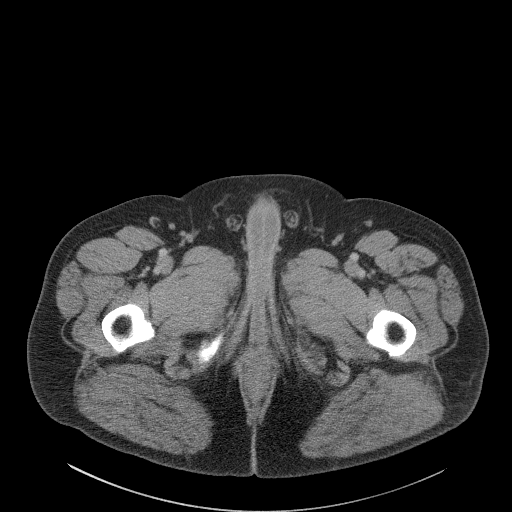
[im 6/96  bone]
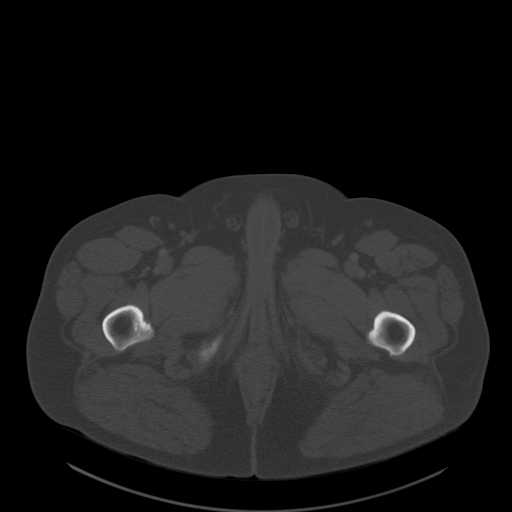
[im 16/96  soft-tissue]
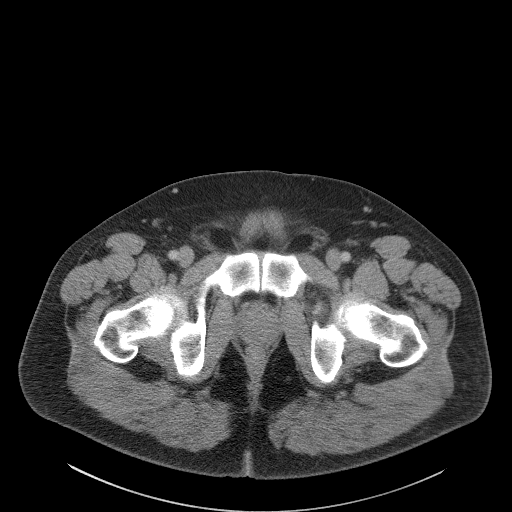
[im 21/96  soft-tissue]
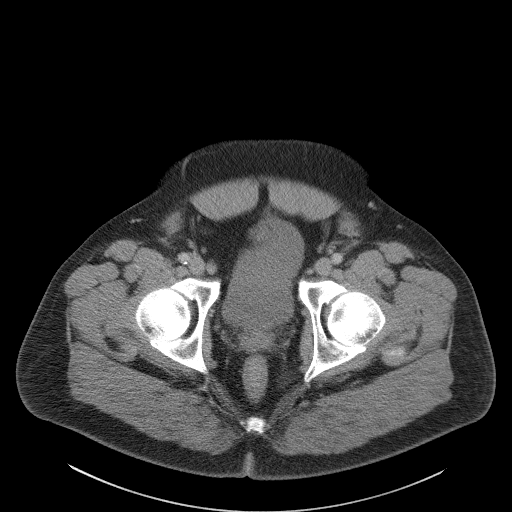
[im 26/96  soft-tissue]
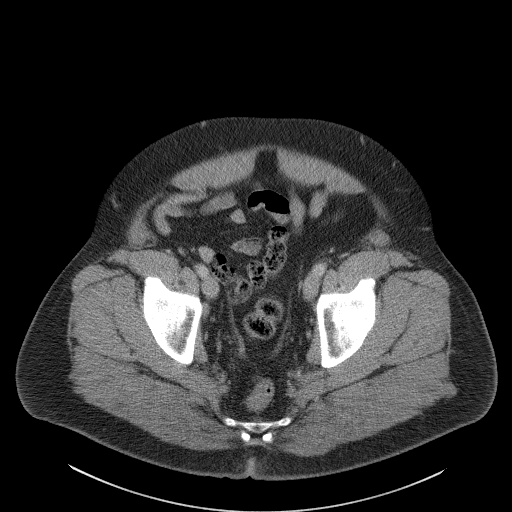
[im 36/96  soft-tissue]
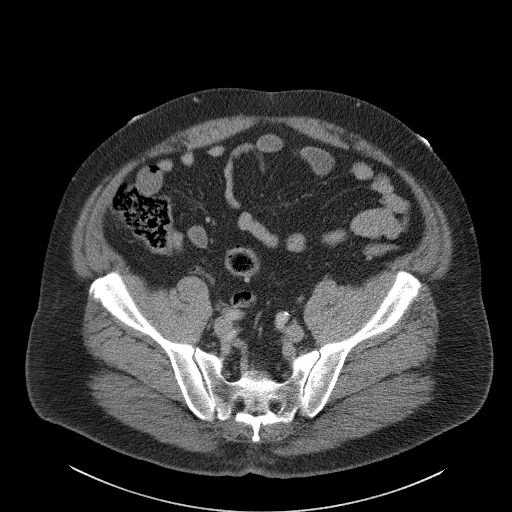
[im 41/96  soft-tissue]
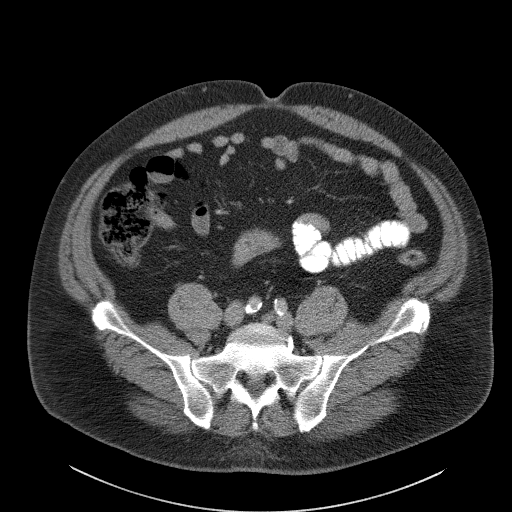
[im 51/96  soft-tissue]
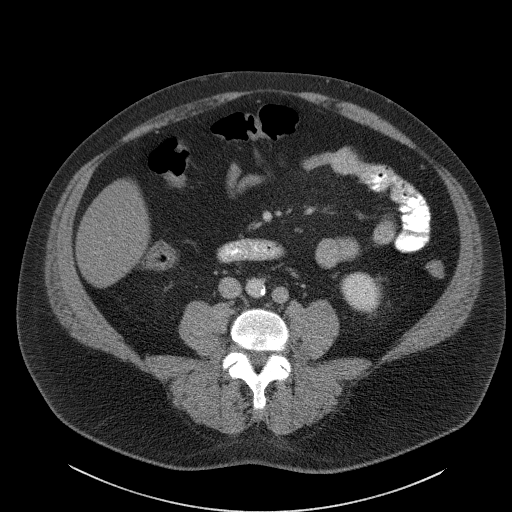
[im 56/96  soft-tissue]
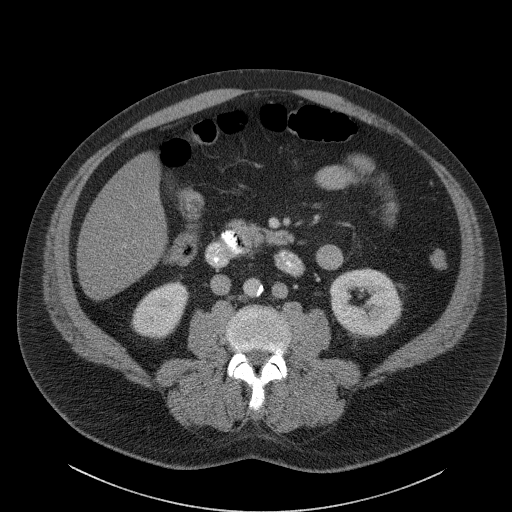
[im 61/96  soft-tissue]
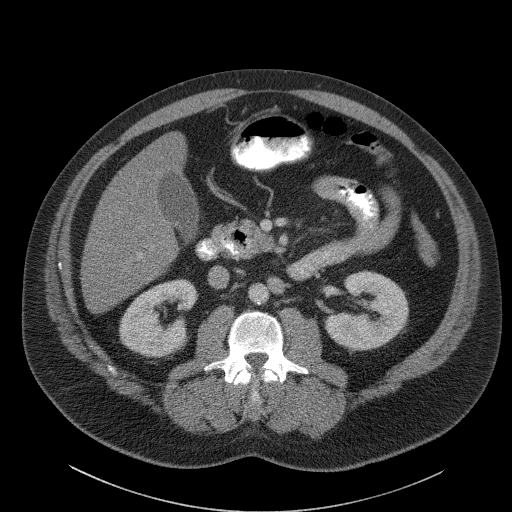
[im 61/96  bone]
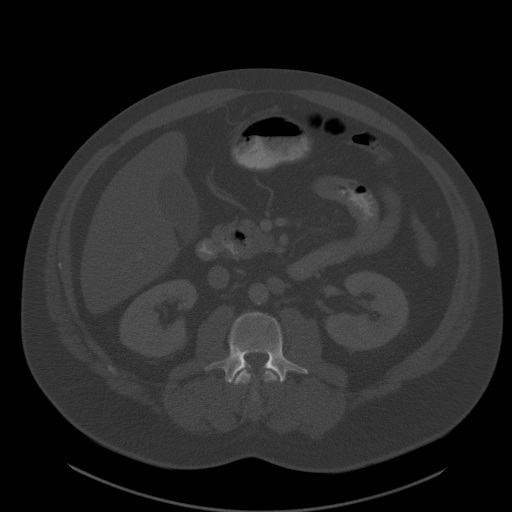
[im 71/96  soft-tissue]
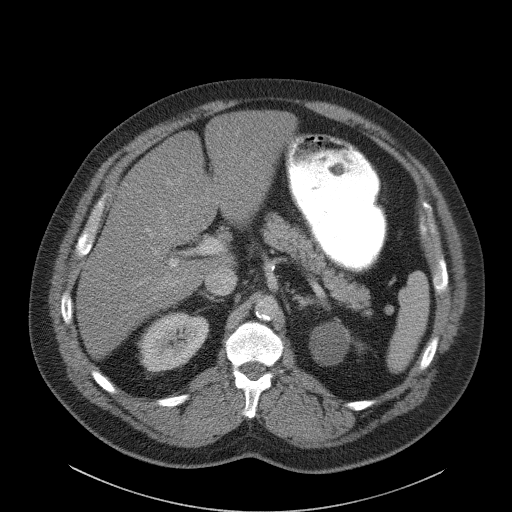
[im 76/96  soft-tissue]
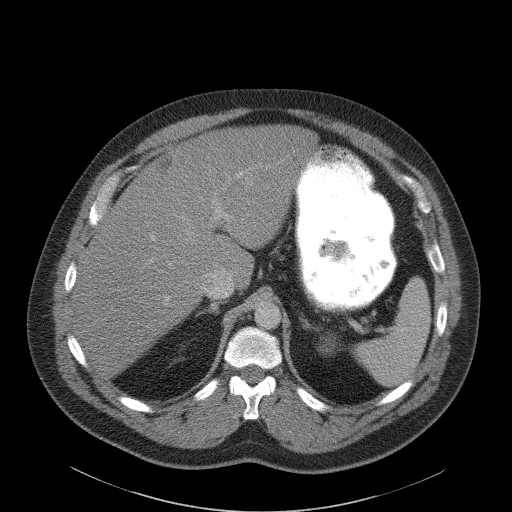
[im 81/96  soft-tissue]
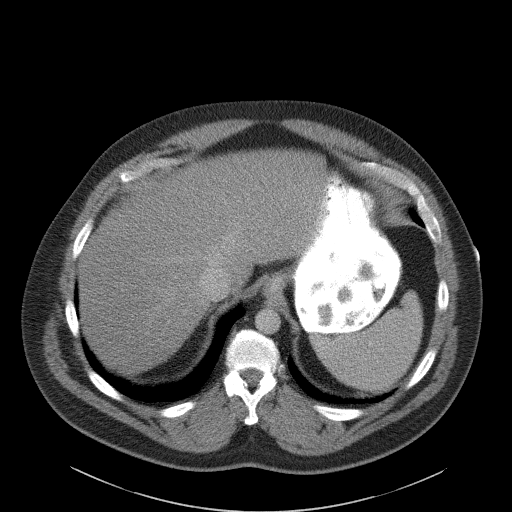
[im 91/96  soft-tissue]
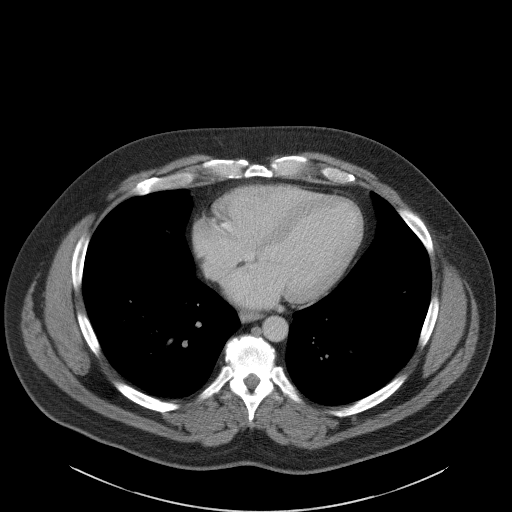

[Series 5: abd/pelvis 3.0 coronal · coronal · 0.96mm/px · 3 of 112 slices shown]
[im 38/112  soft-tissue]
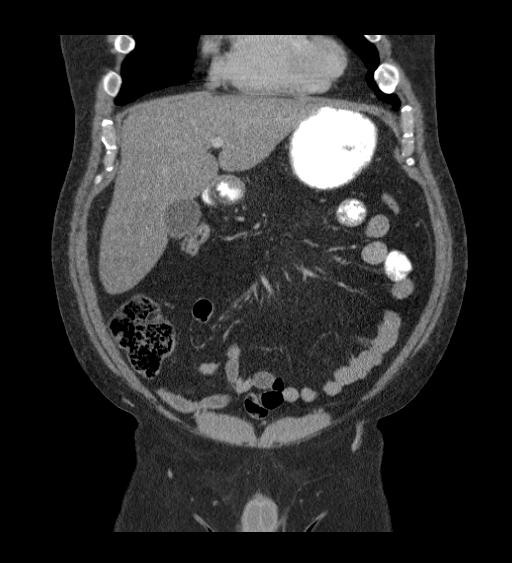
[im 50/112  soft-tissue]
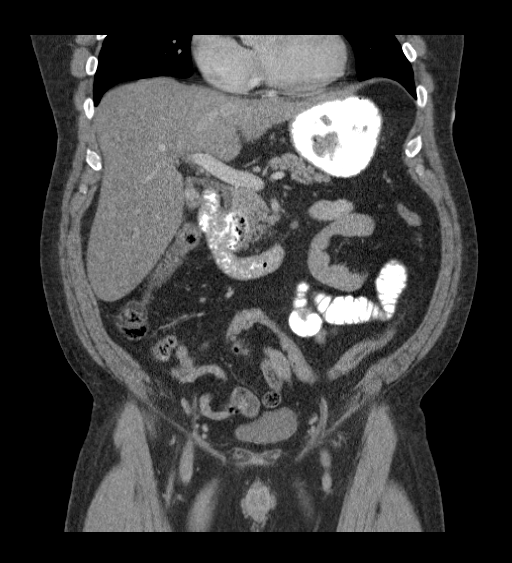
[im 62/112  soft-tissue]
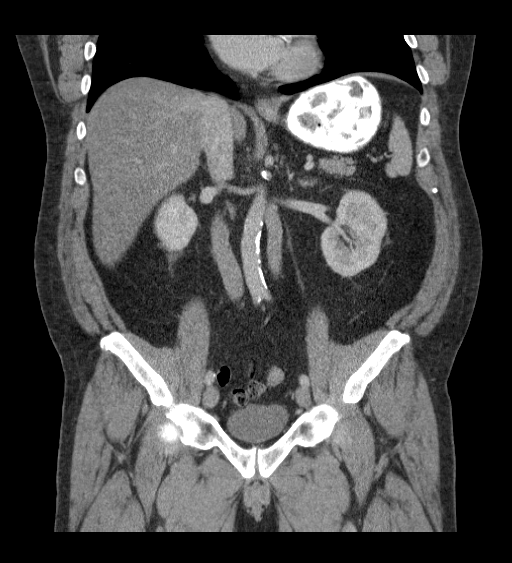

[16 of 46 positions shown; findings below may reference images not displayed]

FINDINGS: The lung bases are clear. Coronary artery calcifications are noted
and appear advanced for the patient's age.

The liver is unremarkable except for a simple cyst in the medial
segment of the left hepatic lobe. There is also diffuse fatty
infiltration. The gallbladder demonstrates gallstones but no
findings for acute cholecystitis. No common bile duct dilatation.
The pancreas is unremarkable. There is a moderate-sized duodenum
diverticulum near the pancreatic head. The spleen is normal in size.
No focal lesions. The adrenal glands are normal. The kidneys
demonstrate cysts but no calculi or hydronephrosis.

The stomach, duodenum, small bowel and colon are grossly normal. No
inflammatory changes, mass lesions or obstructive findings. The
appendix is normal. The terminal ileum is normal. No mesenteric or
retroperitoneal mass or adenopathy. There are moderate
atherosclerotic calcifications involving the aorta, vertically for
the patient's age. No focal aneurysm. There is a duplicated IVC on
the left.

The bladder, prostate gland and seminal vesicles are unremarkable.
No pelvic mass, adenopathy or free pelvic fluid collections. No
inguinal mass or adenopathy.

The bony structures are unremarkable.
IMPRESSION: 1. No acute abdominal/pelvic findings, mass lesions or adenopathy.
2. Diffuse fatty infiltration of the liver.
3. Moderate size duodenum diverticulum noted near the pancreatic
head.
4. Cholelithiasis.
5. Advanced atherosclerotic calcifications given the patient's age.

## 2021-04-06 ENCOUNTER — Other Ambulatory Visit: Payer: Self-pay

## 2021-04-06 ENCOUNTER — Encounter (HOSPITAL_BASED_OUTPATIENT_CLINIC_OR_DEPARTMENT_OTHER): Payer: Self-pay

## 2021-04-06 ENCOUNTER — Emergency Department (HOSPITAL_BASED_OUTPATIENT_CLINIC_OR_DEPARTMENT_OTHER): Payer: Self-pay

## 2021-04-06 ENCOUNTER — Emergency Department (HOSPITAL_BASED_OUTPATIENT_CLINIC_OR_DEPARTMENT_OTHER)
Admission: EM | Admit: 2021-04-06 | Discharge: 2021-04-06 | Disposition: A | Discharge status: Home / Self Care | Payer: Self-pay | Attending: Emergency Medicine | Admitting: Emergency Medicine

## 2021-04-06 DIAGNOSIS — J188 Other pneumonia, unspecified organism: Secondary | ICD-10-CM

## 2021-04-06 DIAGNOSIS — J189 Pneumonia, unspecified organism: Secondary | ICD-10-CM | POA: Insufficient documentation

## 2021-04-06 DIAGNOSIS — Z20822 Contact with and (suspected) exposure to covid-19: Secondary | ICD-10-CM | POA: Insufficient documentation

## 2021-04-06 DIAGNOSIS — J101 Influenza due to other identified influenza virus with other respiratory manifestations: Secondary | ICD-10-CM

## 2021-04-06 DIAGNOSIS — E119 Type 2 diabetes mellitus without complications: Secondary | ICD-10-CM | POA: Insufficient documentation

## 2021-04-06 HISTORY — DX: Type 2 diabetes mellitus without complications: E11.9

## 2021-04-06 HISTORY — DX: Essential (primary) hypertension: I10

## 2021-04-06 LAB — CBC WITH DIFFERENTIAL/PLATELET
Abs Immature Granulocytes: 0.08 10*3/uL — ABNORMAL HIGH (ref 0.00–0.07)
Basophils Absolute: 0.1 10*3/uL (ref 0.0–0.1)
Basophils Relative: 1 %
Eosinophils Absolute: 0.1 10*3/uL (ref 0.0–0.5)
Eosinophils Relative: 0 %
HCT: 37.8 % — ABNORMAL LOW (ref 39.0–52.0)
Hemoglobin: 13.4 g/dL (ref 13.0–17.0)
Immature Granulocytes: 1 %
Lymphocytes Relative: 12 %
Lymphs Abs: 1.6 10*3/uL (ref 0.7–4.0)
MCH: 33.4 pg (ref 26.0–34.0)
MCHC: 35.4 g/dL (ref 30.0–36.0)
MCV: 94.3 fL (ref 80.0–100.0)
Monocytes Absolute: 1.3 10*3/uL — ABNORMAL HIGH (ref 0.1–1.0)
Monocytes Relative: 10 %
Neutro Abs: 10.3 10*3/uL — ABNORMAL HIGH (ref 1.7–7.7)
Neutrophils Relative %: 76 %
Platelets: 209 10*3/uL (ref 150–400)
RBC: 4.01 MIL/uL — ABNORMAL LOW (ref 4.22–5.81)
RDW: 11.9 % (ref 11.5–15.5)
WBC: 13.3 10*3/uL — ABNORMAL HIGH (ref 4.0–10.5)
nRBC: 0 % (ref 0.0–0.2)

## 2021-04-06 LAB — RESP PANEL BY RT-PCR (FLU A&B, COVID) ARPGX2
Influenza A by PCR: POSITIVE — AB
Influenza B by PCR: NEGATIVE
SARS Coronavirus 2 by RT PCR: NEGATIVE

## 2021-04-06 LAB — BASIC METABOLIC PANEL
Anion gap: 13 (ref 5–15)
BUN: 27 mg/dL — ABNORMAL HIGH (ref 8–23)
CO2: 23 mmol/L (ref 22–32)
Calcium: 9.3 mg/dL (ref 8.9–10.3)
Chloride: 99 mmol/L (ref 98–111)
Creatinine, Ser: 1.55 mg/dL — ABNORMAL HIGH (ref 0.61–1.24)
GFR, Estimated: 51 mL/min — ABNORMAL LOW (ref >60)
Glucose, Bld: 182 mg/dL — ABNORMAL HIGH (ref 70–99)
Potassium: 4.6 mmol/L (ref 3.5–5.1)
Sodium: 135 mmol/L (ref 135–145)

## 2021-04-06 LAB — TROPONIN I (HIGH SENSITIVITY): Troponin I (High Sensitivity): 7 ng/L (ref <18)

## 2021-04-06 MED ORDER — AZITHROMYCIN 250 MG PO TABS: 250.0000 mg | ORAL_TABLET | Freq: Every day | ORAL | 0 refills | Status: AC

## 2021-04-06 MED ORDER — AMOXICILLIN 500 MG PO CAPS: 1000.0000 mg | ORAL_CAPSULE | Freq: Three times a day (TID) | ORAL | 0 refills | Status: AC

## 2021-04-06 MED ORDER — BENZONATATE 100 MG PO CAPS: 100.0000 mg | ORAL_CAPSULE | Freq: Three times a day (TID) | ORAL | 0 refills | Status: AC

## 2021-04-06 MED ORDER — AZITHROMYCIN 250 MG PO TABS
500.0000 mg | ORAL_TABLET | Freq: Once | ORAL | Status: AC
  Administered 2021-04-06: 500 mg via ORAL
  Filled 2021-04-06: qty 2

## 2021-04-06 MED ORDER — ALBUTEROL SULFATE HFA 108 (90 BASE) MCG/ACT IN AERS
2.0000 | INHALATION_SPRAY | RESPIRATORY_TRACT | Status: DC | PRN
  Administered 2021-04-06: 2 via RESPIRATORY_TRACT
  Filled 2021-04-06: qty 6.7

## 2021-04-06 MED ORDER — AMOXICILLIN 500 MG PO CAPS
1000.0000 mg | ORAL_CAPSULE | Freq: Once | ORAL | Status: AC
  Administered 2021-04-06: 1000 mg via ORAL
  Filled 2021-04-06: qty 2

## 2021-04-06 NOTE — ED Notes (Signed)
Pt ambulated out of department after d/c instructions gone over.

## 2021-04-06 NOTE — Discharge Instructions (Signed)
Please read and follow all provided instructions.  Your diagnoses today include:  1. Multifocal pneumonia   2. Influenza A     Tests performed today include: Blood counts and electrolytes Chest x-ray -- shows pneumonia COVID and flu testing --positive flu A EKG and 1 cardiac enzyme: Does not show any signs of stress on your heart or signs of heart attack Vital signs. See below for your results today.   Medications prescribed:  Amoxicillin - antibiotic  You have been prescribed an antibiotic medicine: take the entire course of medicine even if you are feeling better. Stopping early can cause the antibiotic not to work.  Azithromycin - antibiotic for respiratory infection  You have been prescribed an antibiotic medicine: take the entire course of medicine even if you are feeling better. Stopping early can cause the antibiotic not to work.  Tessalon Perles - cough suppressant medication  Albuterol inhaler - medication that opens up your airway  Use inhaler as follows: 1-2 puffs with spacer every 4 hours as needed for wheezing, cough, or shortness of breath.   Take any prescribed medications only as directed.  Home care instructions:  Follow any educational materials contained in this packet.  Take the complete course of antibiotics that you were prescribed.   BE VERY CAREFUL not to take multiple medicines containing Tylenol (also called acetaminophen). Doing so can lead to an overdose which can damage your liver and cause liver failure and possibly death.   Follow-up instructions: Please follow-up with your primary care provider in the next 3 days for further evaluation of your symptoms and to ensure resolution of your infection.   Return instructions:  Please return to the Emergency Department if you experience worsening symptoms.  Return immediately with worsening breathing, worsening shortness of breath, or if you feel it is taking you more effort to breathe.  Please  return if you have any other emergent concerns.  Additional Information:  Your vital signs today were: BP (!) 155/79    Pulse 85    Temp 98.5 F (36.9 C) (Oral)    Resp (!) 22    Ht 5\' 9"  (1.753 m)    Wt 109.8 kg    SpO2 96%    BMI 35.74 kg/m  If your blood pressure (BP) was elevated above 135/85 this visit, please have this repeated by your doctor within one month. --------------

## 2021-04-06 NOTE — ED Provider Notes (Signed)
[facility] EMERGENCY DEPARTMENT Provider Note   CSN: [id number] Arrival date & time: 04/06/21  1236     History  Chief Complaint  Patient presents with   Cough    Brady Friedman is a 61 y.o. male.  Patient with history of polymyalgia rheumatica on 15 mg prednisone daily, history of diabetes --presents to the emergency department for flulike symptoms over the past 1 and half weeks.  Patient reports body aches, cough, shortness of breath, headache.  He becomes very short of breath with activity, such as while he is working.  Cough is productive, nonbloody sputum.  No vomiting or diarrhea reported.  Patient denies history of COPD, asthma, bronchitis, emphysema.  Patient denies risk factors for pulmonary embolism including: unilateral leg swelling, history of DVT/PE/other blood clots, use of exogenous hormones, recent immobilizations, recent surgery, recent travel (>4hr segment), malignancy, hemoptysis.         Home Medications Prior to Admission medications   Medication Sig Start Date End Date Taking? Authorizing Provider  acetaminophen (TYLENOL) 500 MG tablet Take 1,000 mg by mouth every 4 (four) hours as needed. For pain    [provider]  clonazePAM (KLONOPIN) 0.5 MG tablet Take 0.5 mg by mouth 2 (two) times daily as needed.    [provider]  cyclobenzaprine (FLEXERIL) 10 MG tablet Take 1 tablet (10 mg total) by mouth 3 (three) times daily as needed. For pain 07/03/11   Fransico Meadow, PA-C  GREEN COFFEE BEAN PO Take 1 capsule by mouth 2 (two) times daily.    [provider]  HYDROcodone-acetaminophen (LORTAB) 7.5-500 MG per tablet Take 1.5 tablets by mouth 3 (three) times daily as needed. For pain    [provider]  Multiple Vitamin (MULITIVITAMIN WITH MINERALS) TABS Take 1 tablet by mouth daily.    [provider]  omeprazole (PRILOSEC) 20 MG capsule Take 1 capsule (20 mg total) by mouth daily. 12/14/13   Palumbo, April, MD   OVER THE COUNTER MEDICATION Take 1 capsule by mouth daily.    [provider]  oxyCODONE-acetaminophen (PERCOCET) 5-325 MG per tablet Take 1 tablet by mouth every 6 (six) hours as needed. 12/14/13   Palumbo, April, MD  sertraline (ZOLOFT) 100 MG tablet Take 100 mg by mouth daily.    [provider]      Allergies    Hydrocodone    Review of Systems   Review of Systems  Constitutional:  Negative for chills and fever.  HENT:  Positive for congestion. Negative for rhinorrhea and sore throat.   Eyes:  Negative for redness.  Respiratory:  Positive for cough and shortness of breath. Negative for wheezing.   Cardiovascular:  Negative for chest pain and leg swelling.  Gastrointestinal:  Negative for abdominal pain, diarrhea, nausea and vomiting.  Genitourinary:  Negative for dysuria and hematuria.  Musculoskeletal:  Positive for myalgias.  Skin:  Negative for rash.  Neurological:  Positive for headaches.   Physical Exam Updated Vital Signs BP (!) 155/76 (BP Location: Right Arm)    Pulse 92    Temp 98.5 F (36.9 C) (Oral)    Resp 20    Ht 5\' 9"  (1.753 m)    Wt 109.8 kg    SpO2 97%    BMI 35.74 kg/m  Physical Exam Vitals and nursing note reviewed.  Constitutional:      General: He is not in acute distress.    Appearance: He is well-developed.  HENT:  Head: Normocephalic and atraumatic.     Right Ear: External ear normal.     Left Ear: External ear normal.     Nose: Congestion present.     Mouth/Throat:     Mouth: Mucous membranes are moist.     Pharynx: No oropharyngeal exudate.  Eyes:     General:        Right eye: No discharge.        Left eye: No discharge.     Conjunctiva/sclera: Conjunctivae normal.  Cardiovascular:     Rate and Rhythm: Normal rate and regular rhythm.     Heart sounds: Normal heart sounds.  Pulmonary:     Effort: Pulmonary effort is normal.     Breath sounds: Wheezing present.     Comments: Mild expiratory wheezing, noted at the  bases bilaterally Abdominal:     Palpations: Abdomen is soft.     Tenderness: There is no abdominal tenderness.  Musculoskeletal:     Cervical back: Normal range of motion and neck supple.  Skin:    General: Skin is warm and dry.  Neurological:     Mental Status: He is alert.    ED Results / Procedures / Treatments   Labs (all labs ordered are listed, but only abnormal results are displayed) Labs Reviewed  RESP PANEL BY RT-PCR (FLU A&B, COVID) ARPGX2 - Abnormal; Notable for the following components:      Result Value   Influenza A by PCR POSITIVE (*)    All other components within normal limits  CBC WITH DIFFERENTIAL/PLATELET - Abnormal; Notable for the following components:   WBC 13.3 (*)    RBC 4.01 (*)    HCT 37.8 (*)    Neutro Abs 10.3 (*)    Monocytes Absolute 1.3 (*)    Abs Immature Granulocytes 0.08 (*)    All other components within normal limits  BASIC METABOLIC PANEL - Abnormal; Notable for the following components:   Glucose, Bld 182 (*)    BUN 27 (*)    Creatinine, Ser 1.55 (*)    GFR, Estimated 51 (*)    All other components within normal limits  TROPONIN I (HIGH SENSITIVITY)    EKG None  Radiology DG Chest Portable 1 View  Result Date: 04/06/2021 CLINICAL DATA:  Cough, shortness of breath EXAM: PORTABLE CHEST 1 VIEW COMPARISON:  Images of previous study done on 11/28/2018 are not available for comparison due to technical difficulties. Report for the previous study was reviewed. FINDINGS: Cardiac size is within normal limits. There are small patchy infiltrates in both lower lung fields, more so on the left side. There are no signs of alveolar pulmonary edema. There is no pleural effusion or pneumothorax. IMPRESSION: There are patchy alveolar infiltrates in both lower lung fields, more so on the left side suggesting possible multifocal pneumonia. There is no pleural effusion. Electronically Signed   By: Elmer Picker M.D.   On: 04/06/2021 13:49     Procedures Procedures    Medications Ordered in ED Medications  albuterol (VENTOLIN HFA) 108 (90 Base) MCG/ACT inhaler 2 puff (2 puffs Inhalation Given 04/06/21 1936)  azithromycin (ZITHROMAX) tablet 500 mg (500 mg Oral Given 04/06/21 1932)  amoxicillin (AMOXIL) capsule 1,000 mg (1,000 mg Oral Given 04/06/21 1932)    ED Course/ Medical Decision Making/ A&P    Patient seen and examined. Plan discussed with patient.   Labs: Influenza positive, COVID-negative.  Given risk factors including diabetes and chronic prednisone use, will check labs including  CBC, BMP.  EKG ordered.  Imaging: concern for multifocal pneumonia.  Medications/Fluids: Will need antibiotics for pneumonia.  Vital signs reviewed and are as follows: BP (!) 155/76 (BP Location: Right Arm)    Pulse 92    Temp 98.5 F (36.9 C) (Oral)    Resp 20    Ht 5\' 9"  (1.753 m)    Wt 109.8 kg    SpO2 97%    BMI 35.74 kg/m   Initial impression: Concern for bacterial community-acquired pneumonia in setting of recent influenza.  7:10 PM Reviewed pertinent lab work.  White blood cell count is mildly elevated, but could be partially due to chronic steroid use.  Creatinine is elevated to 1.5 from previous value, however this was from 8 years ago.  EKG reviewed, shows right bundle branch block, morphology mostly unchanged.  Given abnormal EKG, continued shortness of breath --we will check 1 troponin prior to discharge.  Oral antibiotics for treatment of community-acquired pneumonia have been ordered.  Patient has been ambulated and maintained oxygen saturation 93-94% on room air.  Per tech note, he was short of breath.  8:46 PM On re-exam patient appears comfortable.  Family member now at bedside.  Discussed pertinent results with patient at bedside. This included lab work, chest x-ray, troponin. They are comfortable with discharge at this time.   Home treatment: Prescription written for azithromycin, amoxicillin, Tessalon.  Albuterol  inhaler given for home. Counseled to use tylenol and ibuprofen for supportive treatment.   Follow-up: Encouraged patient to follow-up with their provider for recheck in 3 days. Encouraged return to ED with worsening shortness of breath, chest pain, trouble breathing or increased work of breathing. Patient verbalized understanding and agreed with plan.                             Medical Decision Making  Patient here with flulike illness over the past week and progressively worsening shortness of breath, especially with exertion.  Patient was evaluated and appears to have multifocal pneumonia on chest x-ray, which was reviewed personally.  Also tested positive for flu A.  Suspect bacterial pneumonia in setting of recent influenza infection.  Patient's risk factors include immunocompromise due to chronic steroid use and diabetes.  Lab work-up was undertaken and interpreted personally.  Patient with leukocytosis, likely in part due to steroid use.  No hypoxia with ambulation.  EKG with chronic changes, no ischemic changes and troponin normal x1.  I have low concern for ACS or PE at this time.  Patient does not meet criteria for inpatient admission currently, but symptoms only need to be closely watched.  Encouraged recheck in 3 days.  He seems reliable to return if symptoms worsen.        Final Clinical Impression(s) / ED Diagnoses Final diagnoses:  Multifocal pneumonia  Influenza A    Rx / DC Orders ED Discharge Orders          Ordered    amoxicillin (AMOXIL) 500 MG capsule  3 times daily        04/06/21 2042    azithromycin (ZITHROMAX) 250 MG tablet  Daily        04/06/21 2042    benzonatate (TESSALON) 100 MG capsule  Every 8 hours        04/06/21 2042              Carlisle Cater, PA-C 04/06/21 2049    Lennice Sites, DO 04/06/21 2136

## 2021-04-06 NOTE — ED Notes (Signed)
Pt ambulated on RA between SPO2 93-94%. HR between 95-100. Pt seemed slightly SOB while walking.

## 2021-04-06 NOTE — ED Triage Notes (Signed)
Pt c/o flu like sx x 1.5 weeks-NAD-steady gait °

## 2022-02-13 IMAGING — DX DG CHEST 1V PORT
1 series · 1 of 1 positions shown · non-contrast
Comparison: Images of previous study done on 11/28/2018 are not
available for comparison due to technical difficulties. Report for
the previous study was reviewed.

CLINICAL DATA: Cough, shortness of breath

EXAM:
PORTABLE CHEST 1 VIEW

[chest ap]
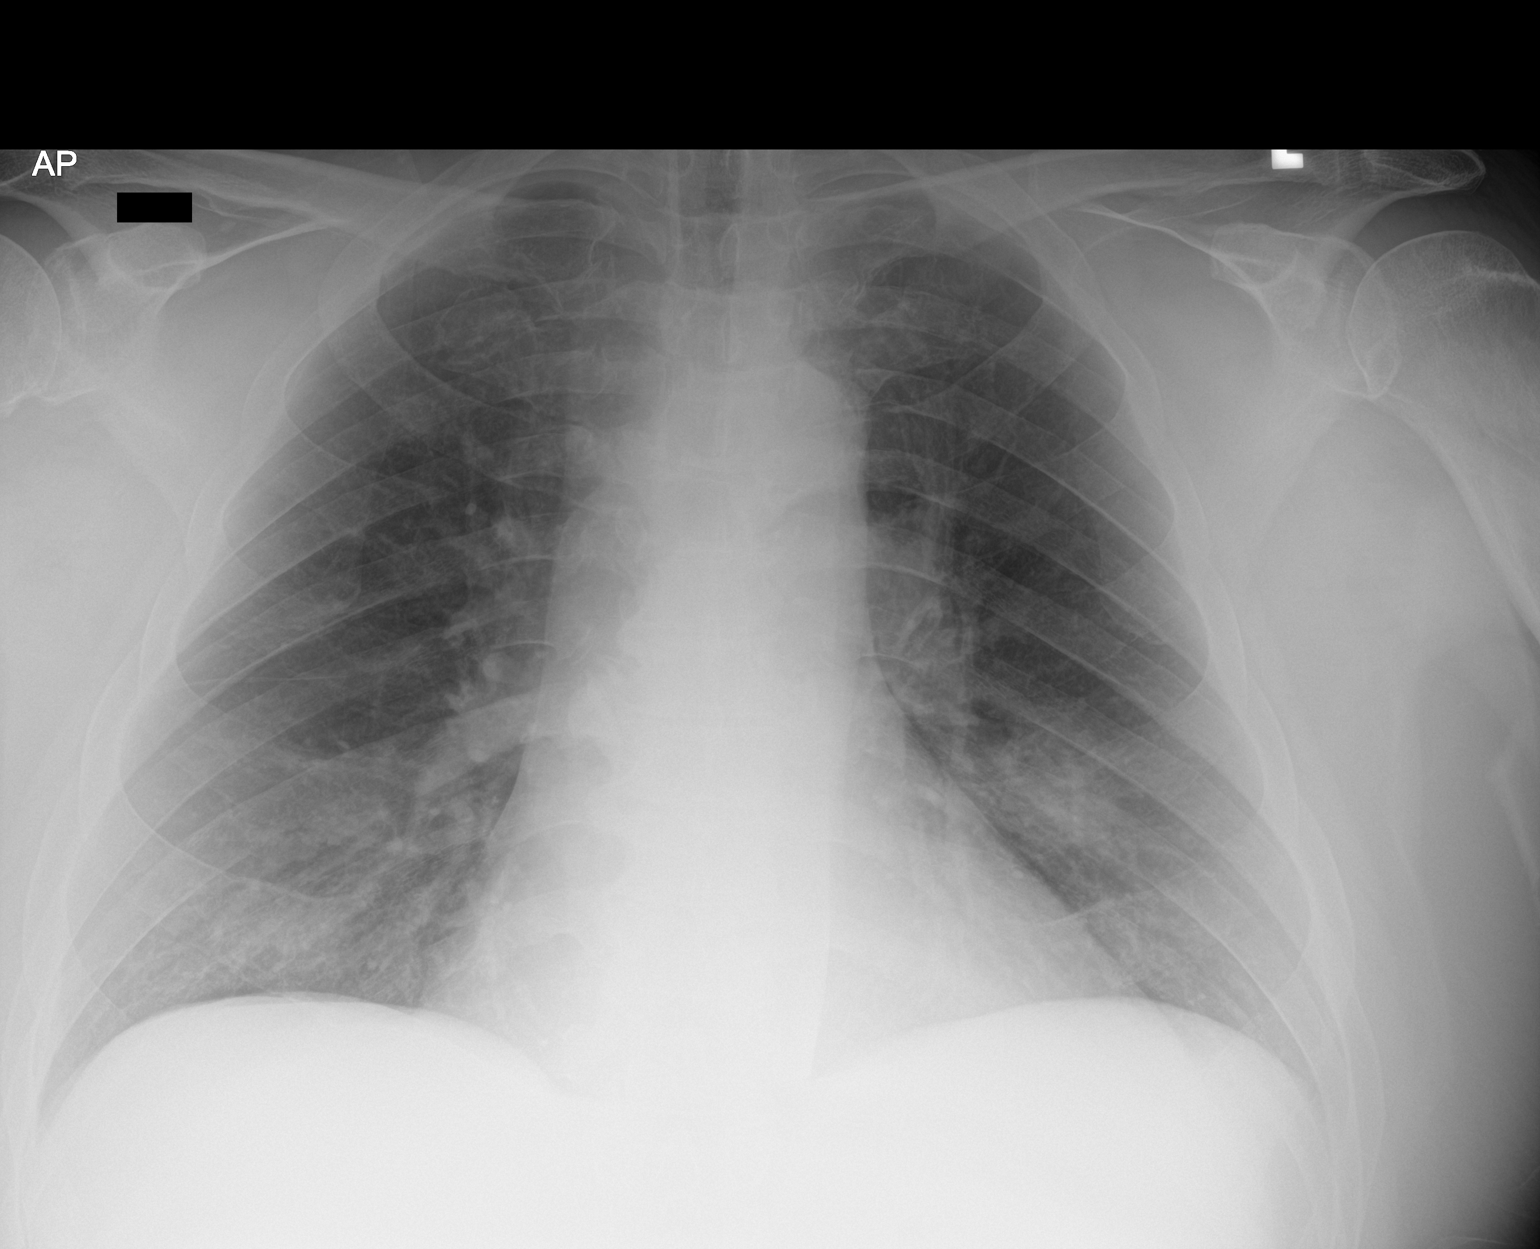

[1 of 1 positions shown; findings below may reference images not displayed]

FINDINGS: Cardiac size is within normal limits. There are small patchy
infiltrates in both lower lung fields, more so on the left side.
There are no signs of alveolar pulmonary edema. There is no pleural
effusion or pneumothorax.
IMPRESSION: There are patchy alveolar infiltrates in both lower lung fields,
more so on the left side suggesting possible multifocal pneumonia.
There is no pleural effusion.
